# Patient Record
Sex: Female | Born: 2000 | Race: White
Health system: Southern US, Community
[De-identification: ages and names within clinical notes are randomized; demographics above are authoritative.]

## PROBLEM LIST (undated history)

## (undated) DIAGNOSIS — F32A Depression, unspecified: Secondary | ICD-10-CM

## (undated) DIAGNOSIS — L600 Ingrowing nail: Secondary | ICD-10-CM

## (undated) DIAGNOSIS — F419 Anxiety disorder, unspecified: Secondary | ICD-10-CM

## (undated) DIAGNOSIS — M25571 Pain in right ankle and joints of right foot: Secondary | ICD-10-CM

## (undated) DIAGNOSIS — J302 Other seasonal allergic rhinitis: Secondary | ICD-10-CM

## (undated) HISTORY — DX: Pain in right ankle and joints of right foot: M25.571

## (undated) HISTORY — DX: Ingrowing nail: L60.0

## (undated) HISTORY — PX: NO PAST SURGERIES: SHX2092

## (undated) HISTORY — DX: Anxiety disorder, unspecified: F41.9

## (undated) HISTORY — DX: Other seasonal allergic rhinitis: J30.2

## (undated) HISTORY — DX: Depression, unspecified: F32.A

---
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
## 2019-06-13 ENCOUNTER — Encounter (INDEPENDENT_AMBULATORY_CARE_PROVIDER_SITE_OTHER): Payer: Self-pay | Admitting: Foot & Ankle Surgery

## 2019-06-13 ENCOUNTER — Ambulatory Visit (INDEPENDENT_AMBULATORY_CARE_PROVIDER_SITE_OTHER): Payer: No Typology Code available for payment source | Admitting: Foot & Ankle Surgery

## 2019-06-13 VITALS — BP 118/78 | HR 76 | Wt 120.0 lb

## 2019-06-13 DIAGNOSIS — M79671 Pain in right foot: Secondary | ICD-10-CM

## 2019-06-13 DIAGNOSIS — S93601S Unspecified sprain of right foot, sequela: Secondary | ICD-10-CM

## 2019-06-13 NOTE — Progress Notes (Signed)
MRI RIGHT Foot

## 2019-06-13 NOTE — Progress Notes (Signed)
Podiatric Surgery Patient Visit    Patient Name: Marie Marie,Marie Marie  Assessment:   -Patient is 19 y.o. female with acute right midfoot pain-unclear diagnosis, likely strain or sprain  X-ray imaging and MRI imaging normal    Impression: Pain with unknown diagnosis (Acute complicated)    Plan:   -I personally evaluated and reviewed x-ray report from x-rays done on 05/11/2019.  These images were not available.  -I personally evaluated and reviewed MRI images on a disc.  These images were interpreted by me with no concerning findings  Full MRI report was scanned into epic  -Unclear at this point what caused her pain.  She does seem improved  -Prescription for new custom orthotics provided to patient.  She was seen and evaluated by specialized orthopedics  -Recommend a course of physical therapy as patient is already completed extensive period of rest including using a boot for 4 weeks.  Work on return to college level across  -F/U 6 weeks for reevaluation    ----------------------------------------------------------------------------------------------------------------------------  Amount/Complexity data reviewed (Choose 1 for level 2/3/4, 2 for level 5): Independent test interpretation (MRI, XR, etc) (level 4/5): X-ray report reviewed, MRI imaging independently interpreted        Subjective:     Chief Complaint:   Chief Complaint   Patient presents with   . Foot Pain     Right foot pain       HPI:  Marie Marie is a 19 y.o. female.  Who presents with chief complaint of right foot pain.  She is a new patient.  She is here with her mom today.  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  Pain came out of nowhere with no memorable injury.  She had an x-ray done which was negative.  She has the report with her.  She was in a cam boot for 4 weeks.  She is tried resting and ice.  She ultimately had an MRI done on 06/01/2019.  MRI imaging was done at [facility] diagnostic imaging.  She  has a copy of the CD with her today.  She feels overall better but is unclear what her diagnosis is and when she can return to sports.  She is here with her mom today.      Denies N/V/F/C/SOB/CP  All other systems were reviewed and are negative  The following portions of the patient's history were reviewed and updated as appropriate: allergies, current medications, past medical history, past surgical history, family history, and problem list.    Physical Exam:     Vitals:    06/13/19 0955   BP: 118/78   Pulse: 76   SpO2: 99%       Gen: AAOx3, NAD, Well developed  HEENT: Normocephalic, EOMI  Heart: Regular pulse rate and rhythm  Vascular:  Palpable pedal pulses b/l, CFT to all toes <3 sec. No edema. No varicosities  Derm:  Skin intact without wounds or rashes.  No erythema or ecchymosis.  MSK:   Overall exam the right foot has only minimal and mild tenderness over the first and second tarsometatarsal joint.  No gross instability.  No pain with inversion eversion stress.  MMS 5/5 and symmetric, No other areas of pain or tenderness. No gross deformity. ROM normal without crepitus.  Neuro:  SILT. Neg tinel's sign.  Able to wiggle toes. No motor deficits.       Standing and gait exam: Gait is nonantalgic.  Normal gait.  Radiology:     Xrays taken in clinic today: None    X-ray x3 views performed on 05/11/2019  Impression: Negative radiographs of the right foot    MRI imaging done at [facility] diagnostic imaging  Date of study 06/01/2019  Impression: No fractures or soft tissue abnormality.  Possible minimal degenerative changes at the first MTP joint.    I personally reviewed the disc of these images and agree that there is no concerning findings.        Claudette Head, DPM FACFAS  [facility] Podiatric Surgery  Pager [phone]    ------------------------------------------------------------------------------------------------------------------------------  The review of the patient's medications does not in any  way constitute an endorsement, by this clinician,  of their use, dosage, indications, route, efficacy, interactions, or other clinical parameters.    This note was generated within the EPIC EMR using Dragon medical speech recognition software and may contain inherent errors or omissions not intended by the user. Grammatical and punctuation errors, random word insertions, deletions, pronoun errors and incomplete sentences are occasional consequences of this technology due to software limitations. Not all errors are caught or corrected.  Although every attempt is made to root out erroneus and incomplete transcription, the note may still not fully represent the intent or opinion of the author. If there are questions or concerns about the content of this note or information contained within the body of this dictation they should be addressed directly with the author for clarification.

## 2019-07-05 ENCOUNTER — Encounter (INDEPENDENT_AMBULATORY_CARE_PROVIDER_SITE_OTHER): Payer: Self-pay | Admitting: Orthopaedic Surgery

## 2019-07-05 ENCOUNTER — Ambulatory Visit (INDEPENDENT_AMBULATORY_CARE_PROVIDER_SITE_OTHER): Payer: No Typology Code available for payment source | Admitting: Orthopaedic Surgery

## 2019-07-05 VITALS — BP 113/76 | HR 81

## 2019-07-05 DIAGNOSIS — S93621A Sprain of tarsometatarsal ligament of right foot, initial encounter: Secondary | ICD-10-CM

## 2019-07-05 NOTE — Progress Notes (Signed)
[facility] Orthopaedic [facility] Medicine        Provider: Aldean Baker, MD  Patient: Marie Mata  DOB: 28-Oct-2000  AGE: 19 y.o.  MR#:  [id number]  Sport/Activity: lacrosse at [employer]    SUBJECTIVE:  Marie Mata is a pleasant 19 y.o. female who presents for evaluation of right foot pain that started in mid March of this year. The patient cannot recall any specific inciting event, but she was in the middle of her lacrosse season when this happened. During this time, she said she was running and working out more than she ever had before. Of note, she first noted symptoms on the left side in a similar area but this only lasted a few weeks before the right side started bothering her. She was seen and imaged with plain films both locally and by a doctor at [facility] who did not find anything wrong. An MRI was done of her forefoot that does capture some of the Lisfranc area, that is being read as normal. She was placed into a boot for about 1 month. She has been out of the boot since mid May and walking in a normal shoe. She thinks the boot did help her some but her pain continued after transitioning out. She tried some Voltaren gel and this also did not help her.    The pain is definitely worse with activity, and weightbearing. Although she also gets some soreness in the foot at rest if she has been on her feet frequently.    She points to the maximum pain being at the base of the first and second metatarsals and at the first and second TMT joints. She states that when this first started she thinks she noticed some swelling in the foot but no bruising. Mostly her pain is dorsal but when this first happened she was also having plantar base pain as well    The patient is seen today with her mother who provides some of the history.    Past Medical History:   Diagnosis Date   . Ingrown toenail    . Seasonal allergic rhinitis      Past Surgical History:   Procedure Laterality Date   .  NO PAST SURGERIES       Current Outpatient Medications on File Prior to Visit   Medication Sig Dispense Refill   . budesonide-formoterol (SYMBICORT) 80-4.5 MCG/ACT inhaler INHALE TWO PUFFS BY MOUTH BEFORE ACTIVITY     . Fluticasone Propionate (XHANCE NA) by Nasal route Nasal spray     . Norgestim-Eth Estrad Triphasic 0.18/0.215/0.25 MG-25 MCG Tab Take 1 tablet by mouth daily     . sertraline (Zoloft) 50 MG tablet Take 50 mg by mouth daily     . albuterol sulfate HFA (PROVENTIL) 108 (90 Base) MCG/ACT inhaler Inhale 2 puffs into the lungs as needed for Wheezing     . [DISCONTINUED] amitriptyline (ELAVIL) 10 MG tablet Take 3 tablets by mouth at bedtime, wean as instructed 30 tablet 0   . [DISCONTINUED] cephalexin (KEFLEX) 500 MG capsule        No current facility-administered medications on file prior to visit.     Allergies   Allergen Reactions   . Peanut Oil Anaphylaxis   . Sulfa Antibiotics Hives     Social History     Socioeconomic History   . Marital status: Unknown     Spouse name: Not on file   . Number of children: Not on file   .  Years of education: Not on file   . Highest education level: Not on file   Occupational History   . Not on file   Tobacco Use   . Smoking status: Never Smoker   . Smokeless tobacco: Current User   Vaping Use   . Vaping Use: Never used   Substance and Sexual Activity   . Alcohol use: No   . Drug use: Not on file   . Sexual activity: Not on file   Other Topics Concern   . Not on file   Social History Narrative    ** Merged History Encounter **          Social Determinants of Health     Financial Resource Strain:    . Difficulty of Paying Living Expenses:    Food Insecurity:    . Worried About Programme researcher, broadcasting/film/video in the Last Year:    . Barista in the Last Year:    Transportation Needs:    . Freight forwarder (Medical):    Marland Kitchen Lack of Transportation (Non-Medical):    Physical Activity:    . Days of Exercise per Week:    . Minutes of Exercise per Session:    Stress:    . Feeling  of Stress :    Social Connections:    . Frequency of Communication with Friends and Family:    . Frequency of Social Gatherings with Friends and Family:    . Attends Religious Services:    . Active Member of Clubs or Organizations:    . Attends Banker Meetings:    Marland Kitchen Marital Status:    Intimate Partner Violence:    . Fear of Current or Ex-Partner:    . Emotionally Abused:    Marland Kitchen Physically Abused:    . Sexually Abused:      Family History   Problem Relation Age of Onset   . No known problems Mother    . No known problems Father        REVIEW OF SYSTEMS:  All other systems are negative except as mentioned in the HPI    PHYSICAL EXAM:  Vitals:    07/05/19 1133   BP: 113/76   Pulse: 81        General: The patient is a well appearing female who appears the stated age and is in no acute distress    Right foot and ankle:    Skin: intact, no open wounds    Swelling: none    Ecchymosis: absent    Hindfoot Alignment: neutral    Foot Alignment: neutral    Range of Motion:        Ankle joint dorsiflexion w/knee in extension: 5 deg        Ankle joint dorsiflexion w/knee in flexion: 10 deg        Ankle joint plantarflexion with knee: 45 deg   ROM with pain: no          Subtalar joint eversion: 5 deg        Subtalar joint inversion: 10 deg              ROM with pain: no    Tenderness: Globally at the Lisfranc joint, first TMT joint, second TMT joint, and intermetatarsal joint proximally between the first and second            Stability Exams:        Transverse tarsal joint stress: Positive  TMT shuck test: positive                Motor: 5/5 strength with dorsiflexion, plantarflexion, eversion, inversion, EHL, FHL, toe flexion and extension    Sensation: intact to light tough throughout superficial peroneal, deep peroneal, tibial, sural, saphenous nerve distributions    Vascular: 2+ DP and PT pulses      Left (Asymptomatic side) ankle  Alignment: neutral  No tenderness  Tenderness: none  Swelling: none  ROM:  normal  Normal NV exam    IMAGING STUDIES:  MRI of the right foot that was obtained on 06/01/2019 at [facility] diagnostic imaging in [place] was personally reviewed by me. This MRI is 1.5 Tesla that focuses mostly on the forefoot. In some views the Lisfranc joint is able to be visualized. There is some fluid in the proximal first and second metatarsal space with a questionable Lisfranc ligament injury in my opinion. No obvious widening    Official read from the radiologist is normal study.    OUTSIDE RECORDS  I also had a chance to review personally records from the patient's visit with Dr Dennie Fetters in our electronic medical record system.  These records show she was seen on 06/13/19. Physical therapy was ordered as well as orthotics.    IMPRESSION: Right lisfranc injury       ICD-10-CM    1. Lisfranc's sprain, right, initial encounter  [phone]        Plan:    I discussed the physical exam and radiographic findings, and provided information regarding the natural history of lisfranc injuries. Given her exam I recommend further imaging with a weightbearing CT. [facility] and bring the disc back with her. We discussed briefly the operative and nonoperative indications and protocols for this type of injury. I will see her back and discuss this further once we have the results from the CT. In the meantime, she does not necessarily need to go back into the boot while she is waiting for the CT to be done. If she does not have any pain well ambulating in a normal shoe, that she can continue to do so. I recommend avoidance of impact activity such as running or lacrosse in the meantime.      ORDERS PLACED    No orders of the defined types were placed in this encounter.

## 2019-07-12 ENCOUNTER — Encounter (INDEPENDENT_AMBULATORY_CARE_PROVIDER_SITE_OTHER): Payer: Self-pay | Admitting: Orthopaedic Surgery

## 2019-07-12 ENCOUNTER — Ambulatory Visit (INDEPENDENT_AMBULATORY_CARE_PROVIDER_SITE_OTHER): Payer: No Typology Code available for payment source | Admitting: Orthopaedic Surgery

## 2019-07-12 VITALS — BP 113/71 | HR 75

## 2019-07-12 DIAGNOSIS — S93621A Sprain of tarsometatarsal ligament of right foot, initial encounter: Secondary | ICD-10-CM

## 2019-07-12 NOTE — Progress Notes (Addendum)
[facility]        Provider: Aldean Baker, MD  Patient: Marie Mata  DOB: Jan 22, 2000  AGE: 19 y.o.  MR#:  [id number]  Sport/Activity: lacrosse at [employer]    SUBJECTIVE:  Marie Mata is a pleasant 19 y.o. female who presents for follow-up evaluation of right foot pain.  She was seen by me last week and there is concern for a Lisfranc injury.  I sent her for a weightbearing CT scan after the MRI was relatively inconclusive.    To review: her pain started in mid March of this year. The patient cannot recall any specific inciting event, but she was in the middle of her lacrosse season when this happened. During this time, she said she was running and working out more than she ever had before. Of note, she first noted symptoms on the left side in a similar area but this only lasted a few weeks before the right side started bothering her. She was seen and imaged with plain films both locally and by a doctor at [facility] who did not find anything wrong. An MRI was done of her forefoot that does capture some of the Lisfranc area, that is being read as normal. She was placed into a boot for about 1 month. She has been out of the boot since mid May and walking in a normal shoe. She thinks the boot did help her some but her pain continued after transitioning out. She tried some Voltaren gel and this also did not help her.    The pain is definitely worse with activity, and weightbearing. Although she also gets some soreness in the foot at rest if she has been on her feet frequently.    She points to the maximum pain being at the base of the first and second metatarsals and at the first and second TMT joints. She states that when this first started she thinks she noticed some swelling in the foot but no bruising. Mostly her pain is dorsal but when this first happened she was also having plantar base pain as well    The patient is seen today with her mother  who provides some of the history.    Past Medical History:   Diagnosis Date   . Ingrown toenail    . Seasonal allergic rhinitis      Past Surgical History:   Procedure Laterality Date   . NO PAST SURGERIES       Current Outpatient Medications on File Prior to Visit   Medication Sig Dispense Refill   . albuterol sulfate HFA (PROVENTIL) 108 (90 Base) MCG/ACT inhaler Inhale 2 puffs into the lungs as needed for Wheezing     . budesonide-formoterol (SYMBICORT) 80-4.5 MCG/ACT inhaler INHALE TWO PUFFS BY MOUTH BEFORE ACTIVITY     . Fluticasone Propionate (XHANCE NA) by Nasal route Nasal spray     . Norgestim-Eth Estrad Triphasic 0.18/0.215/0.25 MG-25 MCG Tab Take 1 tablet by mouth daily     . sertraline (Zoloft) 50 MG tablet Take 50 mg by mouth daily       No current facility-administered medications on file prior to visit.     Allergies   Allergen Reactions   . Peanut Oil Anaphylaxis   . Sulfa Antibiotics Hives     Social History     Socioeconomic History   . Marital status: Unknown     Spouse name: Not on file   . Number of  children: Not on file   . Years of education: Not on file   . Highest education level: Not on file   Occupational History   . Not on file   Tobacco Use   . Smoking status: Never Smoker   . Smokeless tobacco: Current User   Vaping Use   . Vaping Use: Never used   Substance and Sexual Activity   . Alcohol use: No   . Drug use: Not on file   . Sexual activity: Not on file   Other Topics Concern   . Not on file   Social History Narrative    ** Merged History Encounter **          Social Determinants of Health     Financial Resource Strain:    . Difficulty of Paying Living Expenses:    Food Insecurity:    . Worried About Programme researcher, broadcasting/film/video in the Last Year:    . Barista in the Last Year:    Transportation Needs:    . Freight forwarder (Medical):    Marland Kitchen Lack of Transportation (Non-Medical):    Physical Activity:    . Days of Exercise per Week:    . Minutes of Exercise per Session:    Stress:     . Feeling of Stress :    Social Connections:    . Frequency of Communication with Friends and Family:    . Frequency of Social Gatherings with Friends and Family:    . Attends Religious Services:    . Active Member of Clubs or Organizations:    . Attends Banker Meetings:    Marland Kitchen Marital Status:    Intimate Partner Violence:    . Fear of Current or Ex-Partner:    . Emotionally Abused:    Marland Kitchen Physically Abused:    . Sexually Abused:      Family History   Problem Relation Age of Onset   . No known problems Mother    . No known problems Father        REVIEW OF SYSTEMS:  All other systems are negative except as mentioned in the HPI    PHYSICAL EXAM:  There were no vitals filed for this visit.     General: The patient is a well appearing female who appears the stated age and is in no acute distress    Right foot and ankle:    Skin: intact, no open wounds    Swelling: none    Ecchymosis: absent    Hindfoot Alignment: neutral    Foot Alignment: neutral    Range of Motion:        Ankle joint dorsiflexion w/knee in extension: 5 deg        Ankle joint dorsiflexion w/knee in flexion: 10 deg        Ankle joint plantarflexion with knee: 45 deg   ROM with pain: no          Subtalar joint eversion: 5 deg        Subtalar joint inversion: 10 deg              ROM with pain: no    Tenderness: Globally at the Lisfranc joint, first TMT joint, second TMT joint, and intermetatarsal joint proximally between the first and second            Stability Exams:        Transverse tarsal joint stress: Positive  TMT shuck test: positive                Motor: 5/5 strength with dorsiflexion, plantarflexion, eversion, inversion, EHL, FHL, toe flexion and extension    Sensation: intact to light tough throughout superficial peroneal, deep peroneal, tibial, sural, saphenous nerve distributions    Vascular: 2+ DP and PT pulses      Left (Asymptomatic side) ankle  Alignment: neutral  No tenderness  Tenderness: none  Swelling: none  ROM:  normal  Normal NV exam    IMAGING STUDIES:  Weightbearing CT scan of bilateral feet was performed last week on 07/05/2019 at an outside facility and personally reviewed by me.  There is no obvious widening at the TMT, intermetatarsal, or medial cuneiform and second metatarsal joints.    MRI of the right foot that was obtained on 06/01/2019 at [facility] diagnostic imaging in [place] was personally reviewed by me. This MRI is 1.5 Tesla that focuses mostly on the forefoot. In some views the Lisfranc joint is able to be visualized. There is some fluid in the proximal first and second metatarsal space with a questionable Lisfranc ligament injury in my opinion. No obvious widening    Official read from the radiologist is normal study.    IMPRESSION: Right lisfranc sprain      ICD-10-CM    1. Lisfranc's sprain, right, initial encounter  Z61.096E Crutches     Other (DME)     Ambulatory referral to Physical Therapy       Plan:    I discussed the physical exam and radiographic findings, and provided information regarding the natural history of lisfranc injuries.  We can treat her conservatively as a sprain at this time.  I recommend that she is nonweightbearing in a boot for 4 weeks.  I discussed with her the possibility and utility of a PRP injection into the space, and she and her mother are interested in learning more.  I will refer her to Dr. Merril Abbe for evaluation and injection if they so choose.  I will see her back after 4 weeks and likely begin a weightbearing progression at that point.      Given her lack of displacement on CT and the relatively normal MRI, I hope this can be resolved conseratively.  I estimate for her that she may feel like she can return in 3 to 5 months, with 3 months being the best case scenario.  I provided a prescription for physical therapy today to begin in the next 2 weeks to start some gentle ankle range of motion and do some calf stem to prevent atrophy.  We will advance the PT  protocol as her exam improves.      At the follow-up visit, I will recommend she obtain longitudinal arch supports for her shoes as she transitions into normal shoewear    ORDERS PLACED    No orders of the defined types were placed in this encounter.

## 2019-07-13 ENCOUNTER — Encounter (INDEPENDENT_AMBULATORY_CARE_PROVIDER_SITE_OTHER): Payer: Self-pay | Admitting: Sports Medicine

## 2019-07-13 ENCOUNTER — Ambulatory Visit (INDEPENDENT_AMBULATORY_CARE_PROVIDER_SITE_OTHER): Payer: No Typology Code available for payment source | Admitting: Sports Medicine

## 2019-07-13 VITALS — BP 117/70 | HR 75 | Ht 62.0 in | Wt 120.0 lb

## 2019-07-13 DIAGNOSIS — S93621A Sprain of tarsometatarsal ligament of right foot, initial encounter: Secondary | ICD-10-CM

## 2019-07-13 DIAGNOSIS — Z0000 Encounter for general adult medical examination without abnormal findings: Secondary | ICD-10-CM

## 2019-07-13 NOTE — Progress Notes (Signed)
[facility]       Provider: Zack Seal, MD  Date of Exam: 07/13/2019  Patient:  Marie Mata  DOB: [birth year]  AGE: 19 y.o.  MR#:  [id number]    CC: Right Foot Pain    HPI:  Marie Mata is a 19 y.o.  female who presents with c/o midfoot pain which started about 3 months ago. There was no known traumatic injury. However, she does play college lacrosse at [employer] and is extremely active. Her pain is achy and worse with running. An MRI was done on her forefoot and was reportedly benign. A weightbearing CT was later performed and was also normal. She was initially treated with a walking boot for 1 month. There was perhaps some relief with this. However, she continues to have pain with weightbearing, running, and other activities. She was recently seen by Dr. Vincenza Hews and thought to have a lisfrac sprain. She was then referred to me for further evaluation and discussion of treatment options, including the possible use of PRP.    EXAM:   BP 117/70   Pulse 75   Ht 1.575 m (5\' 2" )   Wt 54.4 kg (120 lb)   SpO2 96%   BMI 21.95 kg/m   Constitutional: Pt is well-developed, well-nourished. No distress.   Head: Normocephalic, atraumatic.   Skin: No rash visualized. Pt is not diaphoretic.   Psychiatric: Affect normal. Mood normal.    Right Foot/Ankle  Observation: antalgic gait; no swelling, deformity or ecchymosis  Plantar Arches: pes cavus  Palpation: tender over lisfranc joint  ROM: tight posterior heel cord  Drawer Test: Negative  Talar Tilt Test: negative  External Rotation: negative  Strength testing: globally 5/5  Thompson's Test: normal  Distal Sensory: normal  Distal pulses:  Intact    Data Reviewed  External Notes: I have reviewed the prior notes with each unique source listed:   1. Claudette Head on 06/13/19  2. Aldean Baker on 07/05/19  3. Carleene Cooper on 04/23/19    Outside Radiology:   CLINICAL DATA: Right foot pain towards base of first metatarsal x2    months. Patient states increased pain with walking and running but   also complains of pain at rest. Patient states plantar pain while   walking. No known injury.      EXAM:   RIGHT FOOT COMPLETE - 3+ VIEW      COMPARISON: None.      FINDINGS:   There is no evidence of fracture or dislocation. There is no   evidence of arthropathy or other focal bone abnormality. Soft   tissues are unremarkable.      IMPRESSION:   Negative radiographs of the right foot.       ASSESSMENT/PLAN:   1. Healthcare maintenance  Vitamin D,25 OH, Total   2. Sprain of ligament of tarsometatarsal joint of right foot, initial encounter       Marie Mata is a very pleasant 19 y.o. female with midfoot pain, suspected lisfranc sprain. We had a lengthy discussion about the diagnoses as well as the various treatment options for these problems. The option of PRP was discussed and not recommended at this time for this problem. I have recommended that we check a vitamin D level and supplement this should her levels be low. She will follow-up with Dr. Vincenza Hews as planned and see me back on an as needed basis.    Teena Dunk, MD CAQSM  Primary Care  Sports Medicine Physician  [facility]    ----------------------------------------------------------------------------------------------------------------------------  Impression:   (Acute uncomplicated)  Amount/Complexity data reviewed: Order each test, Review each result/external note, Independent history (need 3 for level 4/5): notes  Risk of Problem/Management: Low (99203/99213)  ------------------------------------------------------------------------------------------------------------------------------  The review of the patient's medications does not in any way constitute an endorsement, by this clinician, of their use, dosage, indications, route, efficacy, interactions, or other clinical parameters.    This note was generated within the EPIC EMR using Dragon  medical speech recognition software and may contain inherent errors or omissions not intended by the user. Grammatical and punctuation errors, random word insertions, deletions, pronoun errors and incomplete sentences are occasional consequences of this technology due to software limitations. Not all errors are caught or corrected.  Although every attempt is made to root out erroneus and incomplete transcription, the note may still not fully represent the intent or opinion of the author. If there are questions or concerns about the content of this note or information contained within the body of this dictation they should be addressed directly with the author for clarification.

## 2019-07-17 ENCOUNTER — Telehealth (INDEPENDENT_AMBULATORY_CARE_PROVIDER_SITE_OTHER): Payer: No Typology Code available for payment source | Admitting: Orthopaedic Surgery

## 2019-07-17 DIAGNOSIS — S93621D Sprain of tarsometatarsal ligament of right foot, subsequent encounter: Secondary | ICD-10-CM

## 2019-07-24 NOTE — Progress Notes (Signed)
This is a telephone visit conducted over the phone with the patient and her mother.    I had a discussion with the patient and her mother regarding the utility of further MRI studies.  She has sought a second opinion and a repeat MRI of the midfoot was suggested given the poor quality of the first study that was done.  The patient has been nonweightbearing in a cam boot for the past 5 days and reports improvement in her pain.    I think that a repeat MRI of the midfoot is a good idea given the fact that the Lisfranc ligaments are poorly visualized on the initial MRI.  She has received multiple different medical opinions, therefore more information is always more helpful.    The patient will obtain this MRI and will let me know when it is finished so I can review it and discuss the results with her.  A total of 20 minutes was spent on the phone with the patient and her mother answering questions and discussing treatment options and diagnostic studies.

## 2019-07-31 ENCOUNTER — Ambulatory Visit (INDEPENDENT_AMBULATORY_CARE_PROVIDER_SITE_OTHER): Payer: No Typology Code available for payment source | Admitting: Foot & Ankle Surgery

## 2019-08-09 ENCOUNTER — Ambulatory Visit (INDEPENDENT_AMBULATORY_CARE_PROVIDER_SITE_OTHER): Payer: No Typology Code available for payment source | Admitting: Orthopaedic Surgery

## 2019-08-09 ENCOUNTER — Encounter (INDEPENDENT_AMBULATORY_CARE_PROVIDER_SITE_OTHER): Payer: Self-pay | Admitting: Orthopaedic Surgery

## 2019-08-09 VITALS — BP 120/69 | HR 71

## 2019-08-09 DIAGNOSIS — S93621A Sprain of tarsometatarsal ligament of right foot, initial encounter: Secondary | ICD-10-CM

## 2019-08-09 NOTE — Progress Notes (Signed)
[facility]        Provider: Aldean Baker, MD  Patient: Marie Mata  DOB: 2000/05/25  AGE: 19 y.o.  MR#:  [id number]  Sport/Activity: lacrosse at [employer]    SUBJECTIVE:  Marie Mata is a pleasant 19 y.o. female who presents for follow-up evaluation of right foot pain, suspected Lisfranc sprain.  She was last seen by me 1 month ago, where we decided to try a period of immobilization in a cam boot, nonweightbearing, for 1 month.  She has been compliant with this and has not put any weight on the foot.  She has no pain.  She has continued to be in physical therapy to maintain joint mobilization.      To review: her pain started in mid March of this year. The patient cannot recall any specific inciting event, but she was in the middle of her lacrosse season when this happened. During this time, she said she was running and working out more than she ever had before. Of note, she first noted symptoms on the left side in a similar area but this only lasted a few weeks before the right side started bothering her. She was seen and imaged with plain films both locally and by a doctor at [facility] who did not find anything wrong. An MRI was done of her forefoot that does capture some of the Lisfranc area, that is being read as normal. She was placed into a boot for about 1 month. She has been out of the boot since mid May and walking in a normal shoe. She thinks the boot did help her some but her pain continued after transitioning out. She tried some Voltaren gel and this also did not help her. The pain is definitely worse with activity, and weightbearing. Although she also gets some soreness in the foot at rest if she has been on her feet frequently.    She also had a weightbearing CT performed which demonstrated no diastases.  She most recently had a repeat MRI of the midfoot.    The patient is seen today with her mother who provides some of the  history.    Past Medical History:   Diagnosis Date   . Ingrown toenail    . Seasonal allergic rhinitis      Past Surgical History:   Procedure Laterality Date   . NO PAST SURGERIES       Current Outpatient Medications on File Prior to Visit   Medication Sig Dispense Refill   . budesonide-formoterol (SYMBICORT) 80-4.5 MCG/ACT inhaler INHALE TWO PUFFS BY MOUTH BEFORE ACTIVITY     . Fluticasone Propionate (XHANCE NA) by Nasal route Nasal spray     . Norgestim-Eth Estrad Triphasic 0.18/0.215/0.25 MG-25 MCG Tab Take 1 tablet by mouth daily     . sertraline (Zoloft) 50 MG tablet Take 50 mg by mouth daily     . albuterol sulfate HFA (PROVENTIL) 108 (90 Base) MCG/ACT inhaler Inhale 2 puffs into the lungs as needed for Wheezing       No current facility-administered medications on file prior to visit.     Allergies   Allergen Reactions   . Peanut Oil Anaphylaxis   . Sulfa Antibiotics Hives     Social History     Socioeconomic History   . Marital status: Unknown     Spouse name: Not on file   . Number of children: Not on file   . Years  of education: Not on file   . Highest education level: Not on file   Occupational History   . Not on file   Tobacco Use   . Smoking status: Never Smoker   . Smokeless tobacco: Current User   Vaping Use   . Vaping Use: Never used   Substance and Sexual Activity   . Alcohol use: No   . Drug use: Not on file   . Sexual activity: Not on file   Other Topics Concern   . Not on file   Social History Narrative    ** Merged History Encounter **          Social Determinants of Health     Financial Resource Strain:    . Difficulty of Paying Living Expenses:    Food Insecurity:    . Worried About Programme researcher, broadcasting/film/video in the Last Year:    . Barista in the Last Year:    Transportation Needs:    . Freight forwarder (Medical):    Marland Kitchen Lack of Transportation (Non-Medical):    Physical Activity:    . Days of Exercise per Week:    . Minutes of Exercise per Session:    Stress:    . Feeling of Stress :     Social Connections:    . Frequency of Communication with Friends and Family:    . Frequency of Social Gatherings with Friends and Family:    . Attends Religious Services:    . Active Member of Clubs or Organizations:    . Attends Banker Meetings:    Marland Kitchen Marital Status:    Intimate Partner Violence:    . Fear of Current or Ex-Partner:    . Emotionally Abused:    Marland Kitchen Physically Abused:    . Sexually Abused:      Family History   Problem Relation Age of Onset   . No known problems Mother    . No known problems Father        REVIEW OF SYSTEMS:  All other systems are negative except as mentioned in the HPI    PHYSICAL EXAM:  Vitals:    08/09/19 1401   BP: 120/69   Pulse: 71        General: The patient is a well appearing female who appears the stated age and is in no acute distress    Right foot and ankle:    Skin: intact, no open wounds    Swelling: none    Ecchymosis: absent    Hindfoot Alignment: neutral    Foot Alignment: neutral    Range of Motion:        Ankle joint dorsiflexion w/knee in extension: 5 deg        Ankle joint dorsiflexion w/knee in flexion: 10 deg        Ankle joint plantarflexion with knee: 45 deg   ROM with pain: no          Subtalar joint eversion: 5 deg        Subtalar joint inversion: 10 deg              ROM with pain: no    Tenderness: none today            Stability Exams:        Transverse tarsal joint stress: negative        TMT shuck test: negative  Motor: 5/5 strength with dorsiflexion, plantarflexion, eversion, inversion, EHL, FHL, toe flexion and extension    Sensation: intact to light tough throughout superficial peroneal, deep peroneal, tibial, sural, saphenous nerve distributions    Vascular: 2+ DP and PT pulses      Left (Asymptomatic side) ankle  Alignment: neutral  No tenderness  Tenderness: none  Swelling: none  ROM: normal  Normal NV exam    IMAGING STUDIES:  Repeat MRI done recently at [facility] of the right midfoot was personally reviewed by me and I agree  with the findings that it is an unremarkable study, there is no bony edema, no evidence of ligamentous injury.    IMPRESSION: Right lisfranc sprain, resolving      ICD-10-CM    1. Sprain of ligament of tarsometatarsal joint of right foot, initial encounter  [phone]        Plan:    I am happy to hear that her pain at least in her current nonweightbearing state has resolved.  I think at this point, given the recent normal MRI, we can proceed with resuming weightbearing and return to activities.  I would like her to do this in a slow, progressive, and controlled manner, to prevent recurrence of her pain or onset of any new injuries.  Over the next week to week and a half I would like her to do a weightbearing progression in the boot followed by a boot wean.  I provided her instructions on how to do this.  I would like her to continue to work with physical therapy to regain normal gait, followed by improved single-leg proprioception.  Once she has mastered this, we can work on a slow return to jog protocol and eventually return to play, which I anticipate ultimately might happen in the next 2 months.      She returns back to school in 1 month.  I would like to see her back right before she leaves, so we can assess how she is doing and I can make recommendations to her trainer down to [facility].     ORDERS PLACED    No orders of the defined types were placed in this encounter.

## 2019-09-05 NOTE — Progress Notes (Signed)
[facility]        Provider: Aldean Baker, MD  Patient: Marie Mata  DOB: 03/16/2000  AGE: 19 y.o.  MR#:  [id number]  Sport/Activity: lacrosse at [employer]    SUBJECTIVE:  Marie Mata is a pleasant 19 y.o. female who presents for follow-up evaluation of right foot pain, suspected Lisfranc sprain.  She was last seen by me 1 month ago, and we continued to advance her activity with PT in preparation for her to return to school.  She has no pain in the foot at this time.  She began running on Tuesday and was able to jog for 5 minutes on the treadmill.    To review: her pain started in mid March of this year. The patient cannot recall any specific inciting event, but she was in the middle of her lacrosse season when this happened. During this time, she said she was running and working out more than she ever had before. Of note, she first noted symptoms on the left side in a similar area but this only lasted a few weeks before the right side started bothering her. She was seen and imaged with plain films both locally and by a doctor at [facility] who did not find anything wrong. An MRI was done of her forefoot that does capture some of the Lisfranc area, that is being read as normal. She was placed into a boot for about 1 month. She has been out of the boot since mid May and walking in a normal shoe. She thinks the boot did help her some but her pain continued after transitioning out. She tried some Voltaren gel and this also did not help her. The pain is definitely worse with activity, and weightbearing. Although she also gets some soreness in the foot at rest if she has been on her feet frequently.    She also had a weightbearing CT performed which demonstrated no diastases.  She most recently had a repeat MRI of the midfoot.    The patient is seen today with her mother who provides some of the history.    Past Medical History:   Diagnosis Date   .  Ingrown toenail    . Seasonal allergic rhinitis      Past Surgical History:   Procedure Laterality Date   . NO PAST SURGERIES       Current Outpatient Medications on File Prior to Visit   Medication Sig Dispense Refill   . albuterol sulfate HFA (PROVENTIL) 108 (90 Base) MCG/ACT inhaler Inhale 2 puffs into the lungs as needed for Wheezing     . budesonide-formoterol (SYMBICORT) 80-4.5 MCG/ACT inhaler INHALE TWO PUFFS BY MOUTH BEFORE ACTIVITY     . Fluticasone Propionate (XHANCE NA) by Nasal route Nasal spray     . Norgestim-Eth Estrad Triphasic 0.18/0.215/0.25 MG-25 MCG Tab Take 1 tablet by mouth daily     . sertraline (Zoloft) 50 MG tablet Take 50 mg by mouth daily       No current facility-administered medications on file prior to visit.     Allergies   Allergen Reactions   . Peanut Oil Anaphylaxis   . Sulfa Antibiotics Hives     Social History     Socioeconomic History   . Marital status: Unknown     Spouse name: Not on file   . Number of children: Not on file   . Years of education: Not on file   .  Highest education level: Not on file   Occupational History   . Not on file   Tobacco Use   . Smoking status: Never Smoker   . Smokeless tobacco: Current User   Vaping Use   . Vaping Use: Never used   Substance and Sexual Activity   . Alcohol use: No   . Drug use: Not on file   . Sexual activity: Not on file   Other Topics Concern   . Not on file   Social History Narrative    ** Merged History Encounter **          Social Determinants of Health     Financial Resource Strain:    . Difficulty of Paying Living Expenses:    Food Insecurity:    . Worried About Programme researcher, broadcasting/film/video in the Last Year:    . Barista in the Last Year:    Transportation Needs:    . Freight forwarder (Medical):    Marland Kitchen Lack of Transportation (Non-Medical):    Physical Activity:    . Days of Exercise per Week:    . Minutes of Exercise per Session:    Stress:    . Feeling of Stress :    Social Connections:    . Frequency of Communication  with Friends and Family:    . Frequency of Social Gatherings with Friends and Family:    . Attends Religious Services:    . Active Member of Clubs or Organizations:    . Attends Banker Meetings:    Marland Kitchen Marital Status:    Intimate Partner Violence:    . Fear of Current or Ex-Partner:    . Emotionally Abused:    Marland Kitchen Physically Abused:    . Sexually Abused:      Family History   Problem Relation Age of Onset   . No known problems Mother    . No known problems Father        REVIEW OF SYSTEMS:  All other systems are negative except as mentioned in the HPI    PHYSICAL EXAM:  There were no vitals filed for this visit.     General: The patient is a well appearing female who appears the stated age and is in no acute distress    Right foot and ankle:    Skin: intact, no open wounds    Swelling: none    Ecchymosis: absent    Hindfoot Alignment: neutral    Foot Alignment: neutral    Range of Motion:        Ankle joint dorsiflexion w/knee in extension: 5 deg        Ankle joint dorsiflexion w/knee in flexion: 10 deg        Ankle joint plantarflexion with knee: 45 deg   ROM with pain: no          Subtalar joint eversion: 5 deg        Subtalar joint inversion: 10 deg              ROM with pain: no    Tenderness: none today            Stability Exams:        Transverse tarsal joint stress: negative        TMT shuck test: negative                Motor: 5/5 strength with dorsiflexion, plantarflexion, eversion, inversion, EHL, FHL, toe flexion  and extension    Sensation: intact to light tough throughout superficial peroneal, deep peroneal, tibial, sural, saphenous nerve distributions    Vascular: 2+ DP and PT pulses      Left (Asymptomatic side) ankle  Alignment: neutral  No tenderness  Tenderness: none  Swelling: none  ROM: normal  Normal NV exam    IMAGING STUDIES:  None new    IMPRESSION: Right lisfranc sprain, resolving      ICD-10-CM    1. Lisfranc's sprain, right, initial encounter  [phone]        Plan:    I am happy  to hear that Almira Coaster is doing better.  I think we can continue to advance her activity.  She should's continue to progress through a return to running protocol over the next few weeks.  Sport specific drills and strengthening exercises are encouraged.  I anticipate she will be able to return to play without any issues in about 1 month's time.  A note was provided today for her to give her trainer    She is any back to school on Saturday.  If she has any issues throughout the season she should reach out to [place].  I also recommend using arch support inserts in her cleats and her sneakers to help support the midfoot.    ORDERS PLACED    No orders of the defined types were placed in this encounter.

## 2019-09-06 ENCOUNTER — Ambulatory Visit (INDEPENDENT_AMBULATORY_CARE_PROVIDER_SITE_OTHER): Payer: No Typology Code available for payment source | Admitting: Orthopaedic Surgery

## 2019-09-06 ENCOUNTER — Encounter (INDEPENDENT_AMBULATORY_CARE_PROVIDER_SITE_OTHER): Payer: Self-pay | Admitting: Orthopaedic Surgery

## 2019-09-06 VITALS — BP 100/97 | HR 77

## 2019-09-06 DIAGNOSIS — S93621A Sprain of tarsometatarsal ligament of right foot, initial encounter: Secondary | ICD-10-CM

## 2019-11-01 ENCOUNTER — Encounter (INDEPENDENT_AMBULATORY_CARE_PROVIDER_SITE_OTHER): Payer: Self-pay

## 2019-11-01 ENCOUNTER — Ambulatory Visit (INDEPENDENT_AMBULATORY_CARE_PROVIDER_SITE_OTHER): Payer: No Typology Code available for payment source | Admitting: Orthopaedic Surgery

## 2019-11-01 ENCOUNTER — Ambulatory Visit (INDEPENDENT_AMBULATORY_CARE_PROVIDER_SITE_OTHER): Payer: No Typology Code available for payment source

## 2019-11-01 ENCOUNTER — Encounter (INDEPENDENT_AMBULATORY_CARE_PROVIDER_SITE_OTHER): Payer: Self-pay | Admitting: Orthopaedic Surgery

## 2019-11-01 VITALS — BP 117/71 | HR 67

## 2019-11-01 DIAGNOSIS — M25571 Pain in right ankle and joints of right foot: Secondary | ICD-10-CM

## 2019-11-01 NOTE — Progress Notes (Signed)
[facility]        Provider: Aldean Baker, MD  Patient: Marie Mata  DOB: [birth year]  AGE: 19 y.o.  MR#:  [id number]  Sport/Activity: lacrosse at [employer]    SUBJECTIVE:  Angelina Venard is a pleasant 19 y.o. female who presents for follow-up evaluation of right foot and ankle pain.  She was last seen by me a few months ago as we had treated her over the summer for a Lisfranc sprain.  That is completely resolved, but for the past month she has had anterior lateral ankle pain on that side.  Pain is worse with activity and weightbearing.  She has no pain at rest.  She gets some pain along the anterior joint line in general but most of it is focal.  She denies any swelling or mechanical symptoms.    She has had x-rays done back at school but does not have them with her today.  She has been working with her trainer at school but without any improvement.  She is in fall ball for the lacrosse season and finds that the ankle bothers her on a daily basis and limits the amount that she is able to participate in.    The patient is seen today with her mother who provides some of the history.    Past Medical History:   Diagnosis Date   . Ingrown toenail    . Seasonal allergic rhinitis      Past Surgical History:   Procedure Laterality Date   . NO PAST SURGERIES       Current Outpatient Medications on File Prior to Visit   Medication Sig Dispense Refill   . albuterol sulfate HFA (PROVENTIL) 108 (90 Base) MCG/ACT inhaler Inhale 2 puffs into the lungs as needed for Wheezing     . budesonide-formoterol (SYMBICORT) 80-4.5 MCG/ACT inhaler INHALE TWO PUFFS BY MOUTH BEFORE ACTIVITY     . Fluticasone Propionate (XHANCE NA) by Nasal route Nasal spray     . Norgestim-Eth Estrad Triphasic 0.18/0.215/0.25 MG-25 MCG Tab Take 1 tablet by mouth daily     . sertraline (ZOLOFT) 100 MG tablet      . sertraline (Zoloft) 50 MG tablet Take 50 mg by mouth daily       No current  facility-administered medications on file prior to visit.     Allergies   Allergen Reactions   . Peanut Oil Anaphylaxis   . Sulfa Antibiotics Hives     Social History     Socioeconomic History   . Marital status: Unknown     Spouse name: Not on file   . Number of children: Not on file   . Years of education: Not on file   . Highest education level: Not on file   Occupational History   . Not on file   Tobacco Use   . Smoking status: Never Smoker   . Smokeless tobacco: Current User   Vaping Use   . Vaping Use: Never used   Substance and Sexual Activity   . Alcohol use: No   . Drug use: Not on file   . Sexual activity: Not on file   Other Topics Concern   . Not on file   Social History Narrative    ** Merged History Encounter **          Social Determinants of Health     Financial Resource Strain:    . Difficulty of Paying Living Expenses:  Food Insecurity:    . Worried About Programme researcher, broadcasting/film/video in the Last Year:    . Barista in the Last Year:    Transportation Needs:    . Freight forwarder (Medical):    Marland Kitchen Lack of Transportation (Non-Medical):    Physical Activity:    . Days of Exercise per Week:    . Minutes of Exercise per Session:    Stress:    . Feeling of Stress :    Social Connections:    . Frequency of Communication with Friends and Family:    . Frequency of Social Gatherings with Friends and Family:    . Attends Religious Services:    . Active Member of Clubs or Organizations:    . Attends Banker Meetings:    Marland Kitchen Marital Status:    Intimate Partner Violence:    . Fear of Current or Ex-Partner:    . Emotionally Abused:    Marland Kitchen Physically Abused:    . Sexually Abused:      Family History   Problem Relation Age of Onset   . No known problems Mother    . No known problems Father        REVIEW OF SYSTEMS:  All other systems are negative except as mentioned in the HPI    PHYSICAL EXAM:  Vitals:    11/01/19 0806   BP: 117/71   Pulse: 67   SpO2: 97%        General: The patient is a well  appearing female who appears the stated age and is in no acute distress    Right foot and ankle:    Skin: intact, no open wounds    Swelling: none    Ecchymosis: absent    Hindfoot Alignment: neutral    Foot Alignment: neutral    Range of Motion:        Ankle joint dorsiflexion w/knee in extension: 5 deg        Ankle joint dorsiflexion w/knee in flexion: 10 deg        Ankle joint plantarflexion with knee: 45 deg   ROM with pain: no          Subtalar joint eversion: 5 deg        Subtalar joint inversion: 10 deg              ROM with pain: no    Tenderness:   Tender to palpation along the anterior lateral aspect of the joint line, but slightly distal to the tibiotalar joint, along the anterior lateral dorsal talar neck          Stability Exams:        Negative anterior drawer  Negative talar tilt  Negative external rotation stress                Motor: 5/5 strength with dorsiflexion, plantarflexion, eversion, inversion, EHL, FHL, toe flexion and extension    Sensation: intact to light tough throughout superficial peroneal, deep peroneal, tibial, sural, saphenous nerve distributions    Vascular: 2+ DP and PT pulses      Left (Asymptomatic side) ankle  Alignment: neutral  No tenderness  Tenderness: none  Swelling: none  ROM: normal  Normal NV exam    IMAGING STUDIES:  None new    IMPRESSION: Right lisfranc sprain, resolved  New acute right ankle pain, AL ankle joint, concerning for poss talar OCD      ICD-10-CM  1. Acute right ankle pain  M25.571 X-ray ankle right AP lateral and oblique including standing     MRI ankle right without contrast       Plan:    We will order an MRI to evaluate for possible talar OCD.  She has pain and tenderness along the joint line that is worse with activity and weightbearing.  She has failed a month of conservative treatment including relative rest, activity modification, and physical therapy.    ORDERS PLACED    Orders Placed This Encounter   Procedures   . X-ray ankle right AP lateral  and oblique including standing

## 2019-11-01 NOTE — Progress Notes (Signed)
Faxed last two office notes from Dr. Vincenza Hews via Epic to [facility] to fax [phone] per request.

## 2019-11-02 ENCOUNTER — Ambulatory Visit
Admission: RE | Admit: 2019-11-02 | Discharge: 2019-11-02 | Disposition: A | Discharge status: Home / Self Care | Payer: No Typology Code available for payment source | Source: Ambulatory Visit | Attending: Orthopaedic Surgery | Admitting: Orthopaedic Surgery

## 2019-11-02 ENCOUNTER — Encounter (INDEPENDENT_AMBULATORY_CARE_PROVIDER_SITE_OTHER): Payer: Self-pay

## 2019-11-02 DIAGNOSIS — M25571 Pain in right ankle and joints of right foot: Secondary | ICD-10-CM | POA: Insufficient documentation

## 2019-11-02 NOTE — Progress Notes (Signed)
Auth/approval obtained via peer to peer with Dr. Vincenza Hews and [facility] MD: [id number]  I called [name] and spoke with her mother to let them know the MRI is now authorized. They will anticipate a call from Dr. Vincenza Hews to review the results.

## 2019-11-06 ENCOUNTER — Other Ambulatory Visit (INDEPENDENT_AMBULATORY_CARE_PROVIDER_SITE_OTHER): Payer: Self-pay | Admitting: Orthopaedic Surgery

## 2019-11-06 MED ORDER — METHYLPREDNISOLONE 4 MG PO TBPK
ORAL_TABLET | ORAL | 0 refills | Status: AC
Start: 2019-11-06 — End: 2019-11-12

## 2019-11-06 MED ORDER — DICLOFENAC SODIUM 75 MG PO TBEC
75.0000 mg | DELAYED_RELEASE_TABLET | Freq: Two times a day (BID) | ORAL | 0 refills | Status: AC
Start: 2019-11-06 — End: 2019-11-20

## 2019-11-07 ENCOUNTER — Other Ambulatory Visit (INDEPENDENT_AMBULATORY_CARE_PROVIDER_SITE_OTHER): Payer: Self-pay | Admitting: Orthopaedic Surgery

## 2019-11-07 DIAGNOSIS — M659 Synovitis and tenosynovitis, unspecified: Secondary | ICD-10-CM

## 2019-12-10 ENCOUNTER — Encounter (INDEPENDENT_AMBULATORY_CARE_PROVIDER_SITE_OTHER): Payer: Self-pay | Admitting: Orthopaedic Surgery

## 2019-12-10 ENCOUNTER — Ambulatory Visit (INDEPENDENT_AMBULATORY_CARE_PROVIDER_SITE_OTHER): Payer: No Typology Code available for payment source | Admitting: Orthopaedic Surgery

## 2019-12-10 VITALS — BP 115/73 | HR 78

## 2019-12-10 DIAGNOSIS — M25871 Other specified joint disorders, right ankle and foot: Secondary | ICD-10-CM

## 2019-12-10 MED ORDER — TRIAMCINOLONE ACETONIDE 40 MG/ML IJ SUSP
20.0000 mg | Freq: Once | INTRAMUSCULAR | Status: AC
Start: 2019-12-10 — End: ?

## 2019-12-10 NOTE — Progress Notes (Signed)
[facility]        Provider: Aldean Baker, MD  Patient: Marie Mata  DOB: 05/14/2000  AGE: 19 y.o.  MR#:  [id number]  Sport/Activity: lacrosse at [employer]    SUBJECTIVE:  Marie Mata is a pleasant 19 y.o. female who presents for follow-up evaluation of right ankle impingement.  Patient was last seen by me a little over 2 months ago and an MRI was ordered.  I had a follow-up visit with her via the telephone after the MRI which showed anterior ankle synovitis.  She at that time had been working with her trainer on ankle mobilization, but continued to have discomfort.  We tried a course of a Medrol Dosepak followed by a 2-week course of diclofenac to help alleviate some of the inflamed synovium.  She said that this did help a good deal and the pain almost completely went away, but it did return after the medication course was completed.  She denies any swelling in the ankle.  She has pain now with any hyper dorsiflexion or plantarflexion, and hurts her even to drive.    The patient is seen today with her father who provides some of the history.    Past Medical History:   Diagnosis Date   . Ingrown toenail    . Seasonal allergic rhinitis      Past Surgical History:   Procedure Laterality Date   . NO PAST SURGERIES       Current Outpatient Medications on File Prior to Visit   Medication Sig Dispense Refill   . albuterol sulfate HFA (PROVENTIL) 108 (90 Base) MCG/ACT inhaler Inhale 2 puffs into the lungs as needed for Wheezing     . budesonide-formoterol (SYMBICORT) 80-4.5 MCG/ACT inhaler INHALE TWO PUFFS BY MOUTH BEFORE ACTIVITY     . Fluticasone Propionate (XHANCE NA) by Nasal route Nasal spray     . Norgestim-Eth Estrad Triphasic 0.18/0.215/0.25 MG-25 MCG Tab Take 1 tablet by mouth daily     . sertraline (ZOLOFT) 100 MG tablet      . [DISCONTINUED] sertraline (Zoloft) 50 MG tablet Take 50 mg by mouth daily       No current facility-administered  medications on file prior to visit.     Allergies   Allergen Reactions   . Peanut Oil Anaphylaxis   . Sulfa Antibiotics Hives     Social History     Socioeconomic History   . Marital status: Unknown     Spouse name: Not on file   . Number of children: Not on file   . Years of education: Not on file   . Highest education level: Not on file   Occupational History   . Not on file   Tobacco Use   . Smoking status: Never Smoker   . Smokeless tobacco: Current User   Vaping Use   . Vaping Use: Never used   Substance and Sexual Activity   . Alcohol use: No   . Drug use: Not on file   . Sexual activity: Not on file   Other Topics Concern   . Not on file   Social History Narrative    ** Merged History Encounter **          Social Determinants of Health     Financial Resource Strain:    . Difficulty of Paying Living Expenses: Not on file   Food Insecurity:    . Worried About Programme researcher, broadcasting/film/video in  the Last Year: Not on file   . Ran Out of Food in the Last Year: Not on file   Transportation Needs:    . Lack of Transportation (Medical): Not on file   . Lack of Transportation (Non-Medical): Not on file   Physical Activity:    . Days of Exercise per Week: Not on file   . Minutes of Exercise per Session: Not on file   Stress:    . Feeling of Stress : Not on file   Social Connections:    . Frequency of Communication with Friends and Family: Not on file   . Frequency of Social Gatherings with Friends and Family: Not on file   . Attends Religious Services: Not on file   . Active Member of Clubs or Organizations: Not on file   . Attends Banker Meetings: Not on file   . Marital Status: Not on file   Intimate Partner Violence:    . Fear of Current or Ex-Partner: Not on file   . Emotionally Abused: Not on file   . Physically Abused: Not on file   . Sexually Abused: Not on file   Housing Stability:    . Unable to Pay for Housing in the Last Year: Not on file   . Number of Places Lived in the Last Year: Not on file   .  Unstable Housing in the Last Year: Not on file     Family History   Problem Relation Age of Onset   . No known problems Mother    . No known problems Father        REVIEW OF SYSTEMS:  All other systems are negative except as mentioned in the HPI    PHYSICAL EXAM:  Vitals:    12/10/19 1614   BP: 115/73   Pulse: 78        General: The patient is a well appearing female who appears the stated age and is in no acute distress    Right foot and ankle:    Skin: intact, no open wounds    Swelling: none    Ecchymosis: absent    Hindfoot Alignment: neutral    Foot Alignment: neutral    Range of Motion:        Ankle joint dorsiflexion w/knee in extension: 5 deg        Ankle joint dorsiflexion w/knee in flexion: 10 deg        Ankle joint plantarflexion with knee: 45 deg   ROM with pain: yes, at extremes of DF and PF          Subtalar joint eversion: 5 deg        Subtalar joint inversion: 10 deg              ROM with pain: no    Tenderness:   Tender to palpation along the anterolateral aspect of the tibiotalar joint line, but also some discomfort around the sinus tarsi          Stability Exams:        Negative anterior drawer  Negative talar tilt  Negative external rotation stress                Motor: 5/5 strength with dorsiflexion, plantarflexion, eversion, inversion, EHL, FHL, toe flexion and extension    Sensation: intact to light tough throughout superficial peroneal, deep peroneal, tibial, sural, saphenous nerve distributions    Vascular: 2+ DP and PT pulses  Left (Asymptomatic side) ankle  Alignment: neutral  No tenderness  Tenderness: none  Swelling: none  ROM: normal  Normal NV exam    IMAGING STUDIES:  None new    IMPRESSION:       ICD-10-CM    1. Ankle impingement syndrome, right  M25.871 triamcinolone acetonide (KENALOG-40) 40 MG/ML injection 20 mg       Plan:  At a long discussion with Almira Coaster and her father today about the treatment options.  She seemed to improve with anti-inflammatory medication regimen that she  was taking orally a few weeks ago but the pain has returned.  She has symptoms of anterior ankle impingement but also some tenderness along the sinus tarsi.  I discussed that a one-time low-dose steroid injection into the ankle joint could be of therapeutic and diagnostic utility for [place].  She is amenable to this.  She will resume physical therapy on Friday of this week, 4 days from now.  I will touch base with her via phone visit in about 3 weeks to see how her response has been to the injection.    ORDERS PLACED    No orders of the defined types were placed in this encounter.    Procedure: Right intra articular ankle corticosteriod injection  After discussing the risks, benefits and alternative treatments to steroid injections to the patient in detail, informed consent was obtained. The patient's right anterior medial ankle was then prepped in a sterile fashion followed by the use of ethyl chloride for anesthesia of the skin.  A 22-gauge needle was introduced intraarticularly with the injection of 2 mL of 1% Xylocaine and 20 mg of Kenalog. The needle was removed, the skin was cleaned and band-aid placed overlying the injection site. The patient was stable throughout there were no complications.

## 2019-12-31 ENCOUNTER — Telehealth (INDEPENDENT_AMBULATORY_CARE_PROVIDER_SITE_OTHER): Payer: No Typology Code available for payment source | Admitting: Orthopaedic Surgery

## 2019-12-31 ENCOUNTER — Encounter (INDEPENDENT_AMBULATORY_CARE_PROVIDER_SITE_OTHER): Payer: Self-pay

## 2019-12-31 DIAGNOSIS — M25871 Other specified joint disorders, right ankle and foot: Secondary | ICD-10-CM

## 2019-12-31 DIAGNOSIS — M659 Synovitis and tenosynovitis, unspecified: Secondary | ICD-10-CM

## 2019-12-31 NOTE — Progress Notes (Signed)
[facility]     TELEPHONE VISIT     Provider: Aldean Baker, MD  Patient: Marie Mata  DOB: [birth year]  AGE: 19 y.o.  MR#:  [id number]  Sport/Activity: lacrosse at [employer]    SUBJECTIVE:  This was a telephone visit  Daesha Damon Baisch is a pleasant 19 y.o. female who presents for follow-up evaluation of right ankle impingement.  Patient was last seen by me 3 weeks ago, at which point a right ankle intra-articular corticosteroid injection was performed. She had some persistent swelling and discomfort for the first week after this injection, but the pain seems to have alleviated some. I also recommended she immobilized in a cam boot, and she has been now ambulating mostly in the cam boot for the past week and a half. Overall she thinks she feels a good deal better. When she is in the boot she does not have any pain. She has not been wearing the boot around the house. She just got home on Sunday for a several week holiday break and is returning to school on January 2.    The patient went to physical therapy this morning back home and has plans to go 3 times per week. When she does nonweightbearing ankle range of motion exercises, she can feel some of the pinching in the anterior lateral aspect of her ankle, but is mild compared to before.    The patient is seen today with her father who provides some of the history.    Past Medical History:   Diagnosis Date   . Ingrown toenail    . Seasonal allergic rhinitis      Past Surgical History:   Procedure Laterality Date   . NO PAST SURGERIES       Current Outpatient Medications on File Prior to Visit   Medication Sig Dispense Refill   . albuterol sulfate HFA (PROVENTIL) 108 (90 Base) MCG/ACT inhaler Inhale 2 puffs into the lungs as needed for Wheezing     . budesonide-formoterol (SYMBICORT) 80-4.5 MCG/ACT inhaler INHALE TWO PUFFS BY MOUTH BEFORE ACTIVITY     . Fluticasone Propionate (XHANCE NA) by Nasal route Nasal  spray     . Norgestim-Eth Estrad Triphasic 0.18/0.215/0.25 MG-25 MCG Tab Take 1 tablet by mouth daily     . sertraline (ZOLOFT) 100 MG tablet        Current Facility-Administered Medications on File Prior to Visit   Medication Dose Route Frequency Provider Last Rate Last Admin   . triamcinolone acetonide (KENALOG-40) 40 MG/ML injection 20 mg  20 mg Intra-articular Once Salem Senate, MD         Allergies   Allergen Reactions   . Peanut Oil Anaphylaxis   . Sulfa Antibiotics Hives     Social History     Socioeconomic History   . Marital status: Unknown   Tobacco Use   . Smoking status: Never Smoker   . Smokeless tobacco: Current User   Vaping Use   . Vaping Use: Never used   Substance and Sexual Activity   . Alcohol use: No   Social History Narrative    ** Merged History Encounter **          Family History   Problem Relation Age of Onset   . No known problems Mother    . No known problems Father        REVIEW OF SYSTEMS:  All other systems are negative except as mentioned in  the HPI    PHYSICAL EXAM:  There were no vitals filed for this visit.      Right foot and ankle:    No exam was performed as this was a telephone visit.     IMAGING STUDIES:  None new    IMPRESSION:       ICD-10-CM    1. Ankle impingement syndrome, right  M25.871    2. Synovitis of right ankle  M65.9        Plan:  I would like her to continue wearing the boot for another week and a half whenever she is out of the house and about. She should continue with physical therapy here 3 times a week. I have discussed the case with her physical therapist and we will work on ankle and hindfoot range of motion exercises, as she may have some elements of sinus tarsi impingement as well. She will be in touch with me after Christmas as she starts to ramp back up with activity and let me know how she is doing. If she has persistent pain as she tries to get back into more advanced activity and out of the boot, she may be a candidate for a sinus tarsi  injection by my partner under the ultrasound. We will evaluate this as her symptoms progress.    ORDERS PLACED    No orders of the defined types were placed in this encounter.

## 2020-04-02 ENCOUNTER — Encounter (INDEPENDENT_AMBULATORY_CARE_PROVIDER_SITE_OTHER): Payer: Self-pay

## 2020-04-02 ENCOUNTER — Other Ambulatory Visit (INDEPENDENT_AMBULATORY_CARE_PROVIDER_SITE_OTHER): Payer: Self-pay

## 2020-04-02 ENCOUNTER — Encounter (INDEPENDENT_AMBULATORY_CARE_PROVIDER_SITE_OTHER): Payer: Self-pay | Admitting: Orthopaedic Surgery

## 2020-04-02 DIAGNOSIS — M25871 Other specified joint disorders, right ankle and foot: Secondary | ICD-10-CM

## 2020-04-02 DIAGNOSIS — M659 Synovitis and tenosynovitis, unspecified: Secondary | ICD-10-CM

## 2020-04-29 ENCOUNTER — Other Ambulatory Visit (INDEPENDENT_AMBULATORY_CARE_PROVIDER_SITE_OTHER): Payer: Self-pay | Admitting: Orthopaedic Surgery

## 2020-04-29 MED ORDER — DICLOFENAC SODIUM 75 MG PO TBEC
75.0000 mg | DELAYED_RELEASE_TABLET | Freq: Two times a day (BID) | ORAL | 0 refills | Status: DC
Start: 2020-04-29 — End: 2020-05-07

## 2020-05-07 ENCOUNTER — Other Ambulatory Visit (INDEPENDENT_AMBULATORY_CARE_PROVIDER_SITE_OTHER): Payer: Self-pay | Admitting: Orthopaedic Surgery

## 2020-05-07 MED ORDER — MELOXICAM 15 MG PO TABS
15.0000 mg | ORAL_TABLET | Freq: Every day | ORAL | 0 refills | Status: AC
Start: 2020-05-07 — End: 2020-07-06

## 2020-06-05 ENCOUNTER — Other Ambulatory Visit (INDEPENDENT_AMBULATORY_CARE_PROVIDER_SITE_OTHER): Payer: Self-pay | Admitting: Orthopaedic Surgery

## 2020-06-05 ENCOUNTER — Ambulatory Visit (INDEPENDENT_AMBULATORY_CARE_PROVIDER_SITE_OTHER): Payer: No Typology Code available for payment source | Admitting: Orthopaedic Surgery

## 2020-06-05 ENCOUNTER — Encounter (INDEPENDENT_AMBULATORY_CARE_PROVIDER_SITE_OTHER): Payer: Self-pay | Admitting: Orthopaedic Surgery

## 2020-06-05 DIAGNOSIS — M25471 Effusion, right ankle: Secondary | ICD-10-CM

## 2020-06-05 DIAGNOSIS — M25871 Other specified joint disorders, right ankle and foot: Secondary | ICD-10-CM

## 2020-06-05 NOTE — Progress Notes (Signed)
[facility]        Provider: Aldean Baker, MD  Patient: Marie Mata  DOB: Dec 24, 2000  AGE: 20 y.o.  MR#:  [id number]  Sport/Activity: lacrosse at [employer]    SUBJECTIVE:  Marie Mata is a pleasant 20 y.o. female who presents for follow-up evaluation of right ankle pain.  She is well-known to me for this problem.  We have been treating her for right ankle impingement which became a problem for her in October of last year but seem to improve with certain treatments by January.  She had had a steroid injection and several months of physical therapy.  She also had a course of Mobic which back in the fall did help her.  From January through the end of April, she essentially had no issues with the ankle.      Then, during a game towards end of the season, she rolled the ankle and had recurrence of the pain with some swelling.  She gets pain particularly with dorsiflexion and impact loading.  She has been taking Mobic for the last 4 weeks, but has not helped her significantly.  I prescribed this for her.  She has continued to work with her trainers at school on dedicated physical therapy for the ankle, but the pain persists.      Past Medical History:   Diagnosis Date   . Ingrown toenail    . Seasonal allergic rhinitis      Past Surgical History:   Procedure Laterality Date   . NO PAST SURGERIES       Current Outpatient Medications on File Prior to Visit   Medication Sig Dispense Refill   . albuterol sulfate HFA (PROVENTIL) 108 (90 Base) MCG/ACT inhaler Inhale 2 puffs into the lungs as needed for Wheezing     . budesonide-formoterol (SYMBICORT) 80-4.5 MCG/ACT inhaler INHALE TWO PUFFS BY MOUTH BEFORE ACTIVITY     . Fluticasone Propionate (XHANCE NA) by Nasal route Nasal spray     . meloxicam (MOBIC) 15 MG tablet Take 1 tablet (15 mg total) by mouth daily 60 tablet 0   . Norgestim-Eth Estrad Triphasic 0.18/0.215/0.25 MG-25 MCG Tab Take 1 tablet by mouth daily      . sertraline (ZOLOFT) 100 MG tablet        Current Facility-Administered Medications on File Prior to Visit   Medication Dose Route Frequency Provider Last Rate Last Admin   . triamcinolone acetonide (KENALOG-40) 40 MG/ML injection 20 mg  20 mg Intra-articular Once Salem Senate, MD         Allergies   Allergen Reactions   . Peanut Oil Anaphylaxis   . Sulfa Antibiotics Hives     Social History     Socioeconomic History   . Marital status: Unknown   Tobacco Use   . Smoking status: Never Smoker   . Smokeless tobacco: Current User   Vaping Use   . Vaping Use: Never used   Substance and Sexual Activity   . Alcohol use: No   Social History Narrative    ** Merged History Encounter **          Family History   Problem Relation Age of Onset   . No known problems Mother    . No known problems Father        REVIEW OF SYSTEMS:  All other systems are negative except as mentioned in the HPI    PHYSICAL EXAM:  There were no  vitals filed for this visit.     General: The patient is a well appearing female who appears the stated age and is in no acute distress    Right foot and ankle:    Skin: intact, no open wounds    Swelling: none    Ecchymosis: absent    Hindfoot Alignment: neutral    Foot Alignment: neutral    Range of Motion:        Ankle joint dorsiflexion w/knee in extension: 5 deg        Ankle joint dorsiflexion w/knee in flexion: 10 deg        Ankle joint plantarflexion with knee: 45 deg   ROM with pain: yes, at extremes of DF and PF          Subtalar joint eversion: 5 deg        Subtalar joint inversion: 10 deg              ROM with pain: no    Tenderness:   Tender to palpation along the anterolateral aspect of the tibiotalar joint line, but also some discomfort around the sinus tarsi          Stability Exams:        Negative anterior drawer  Negative talar tilt  Negative external rotation stress                Motor: 5/5 strength with dorsiflexion, plantarflexion, eversion, inversion, EHL, FHL, toe flexion and  extension    Sensation: intact to light tough throughout superficial peroneal, deep peroneal, tibial, sural, saphenous nerve distributions    Vascular: 2+ DP and PT pulses      Left (Asymptomatic side) ankle  Alignment: neutral  No tenderness  Tenderness: none  Swelling: none  ROM: normal  Normal NV exam    IMAGING STUDIES:  None new    IMPRESSION:       ICD-10-CM    1. Ankle impingement syndrome, right  M25.871 MRI ankle right without contrast     Ambulatory referral to Physical Therapy   2. Right ankle swelling  M25.471 MRI ankle right without contrast       Plan:  The patient had an acute injury recently that has worsened her ankle pain.  She has swelling and pain with weightbearing, concerning for a talar osteochondral defect.  She has tried physical therapy and anti-inflammatory medications for the past 6 weeks without improvement.  At this point, I recommend an MRI to evaluate for cartilage defect.  I will see her back after the MRI is done.    ORDERS PLACED    Orders Placed This Encounter   Procedures   . MRI ankle right without contrast   . Ambulatory referral to Physical Therapy

## 2020-06-10 ENCOUNTER — Encounter (INDEPENDENT_AMBULATORY_CARE_PROVIDER_SITE_OTHER): Payer: Self-pay | Admitting: Orthopaedic Surgery

## 2020-06-11 ENCOUNTER — Telehealth (INDEPENDENT_AMBULATORY_CARE_PROVIDER_SITE_OTHER): Payer: No Typology Code available for payment source | Admitting: Orthopaedic Surgery

## 2020-06-13 ENCOUNTER — Ambulatory Visit: Payer: No Typology Code available for payment source

## 2020-06-17 ENCOUNTER — Ambulatory Visit (INDEPENDENT_AMBULATORY_CARE_PROVIDER_SITE_OTHER): Payer: No Typology Code available for payment source | Admitting: Sports Medicine

## 2020-06-18 ENCOUNTER — Encounter (INDEPENDENT_AMBULATORY_CARE_PROVIDER_SITE_OTHER): Payer: Self-pay | Admitting: Orthopaedic Surgery

## 2020-06-24 ENCOUNTER — Encounter (INDEPENDENT_AMBULATORY_CARE_PROVIDER_SITE_OTHER): Payer: Self-pay

## 2020-06-24 ENCOUNTER — Encounter (INDEPENDENT_AMBULATORY_CARE_PROVIDER_SITE_OTHER): Payer: Self-pay | Admitting: Orthopaedic Surgery

## 2020-06-25 ENCOUNTER — Telehealth (INDEPENDENT_AMBULATORY_CARE_PROVIDER_SITE_OTHER): Payer: No Typology Code available for payment source | Admitting: Orthopaedic Surgery

## 2020-06-25 ENCOUNTER — Ambulatory Visit (INDEPENDENT_AMBULATORY_CARE_PROVIDER_SITE_OTHER): Payer: No Typology Code available for payment source | Admitting: Sports Medicine

## 2020-06-26 ENCOUNTER — Ambulatory Visit (INDEPENDENT_AMBULATORY_CARE_PROVIDER_SITE_OTHER): Payer: No Typology Code available for payment source | Admitting: Sports Medicine

## 2020-06-26 ENCOUNTER — Other Ambulatory Visit: Payer: Self-pay

## 2020-06-26 DIAGNOSIS — M25871 Other specified joint disorders, right ankle and foot: Secondary | ICD-10-CM

## 2020-06-26 DIAGNOSIS — M25571 Pain in right ankle and joints of right foot: Secondary | ICD-10-CM

## 2020-06-26 DIAGNOSIS — M65871 Other synovitis and tenosynovitis, right ankle and foot: Secondary | ICD-10-CM

## 2020-06-27 ENCOUNTER — Ambulatory Visit: Payer: No Typology Code available for payment source

## 2020-06-27 DIAGNOSIS — M25871 Other specified joint disorders, right ankle and foot: Secondary | ICD-10-CM | POA: Insufficient documentation

## 2020-06-27 DIAGNOSIS — M65871 Other synovitis and tenosynovitis, right ankle and foot: Secondary | ICD-10-CM | POA: Insufficient documentation

## 2020-06-27 DIAGNOSIS — M25571 Pain in right ankle and joints of right foot: Secondary | ICD-10-CM | POA: Insufficient documentation

## 2020-06-27 NOTE — PSS Phone Screening (Signed)
Pre-Anesthesia Evaluation    Pre-op phone visit requested by:   Reason for pre-op phone visit: Patient anticipating RIGHT ANKLE ARTHROSCOPY; RIGHT BONE GRAFT, RIGHT BONE GRAFT procedure.     Park in [place] garage [employer])  - arrive 2 hours early.    Day of procedure contact: [facility] [phone].    History of Present Illness/Summary:        Problem List:  Medical Problems             Hospital Problem List  Date Reviewed: 06/05/2020   None           Non-Hospital Problem List  Date Reviewed: 06/05/2020          ICD-10-CM Priority Class Noted    Concussion without loss of consciousness, initial encounter S06.0X0A   01/07/2016    Concussion without loss of consciousness, subsequent encounter S06.0X0D   02/02/2016    Right ankle pain, unspecified chronicity M25.571   06/27/2020    Overview Signed 06/27/2020  8:36 AM by Minus Breeding     Added automatically from request for surgery [phone]           Other synovitis and tenosynovitis, right ankle and foot M65.871   06/27/2020    Overview Signed 06/27/2020  8:36 AM by Minus Breeding     Added automatically from request for surgery [id number]           Subfibular impingement, 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 Anxiety    . Depression    . Ingrown toenail    . Right ankle pain    . Seasonal allergic rhinitis      Past Surgical History:   Procedure Laterality Date   . NO PAST SURGERIES         Current Outpatient Medications:   .  albuterol sulfate HFA (PROVENTIL) 108 (90 Base) MCG/ACT inhaler, Inhale 2 puffs into the lungs as needed for Wheezing, Disp: , Rfl:   .  budesonide-formoterol (SYMBICORT) 80-4.5 MCG/ACT inhaler, every evening, Disp: , Rfl:   .  Fluticasone Propionate (XHANCE NA), by Nasal route daily Nasal spray, Disp: , Rfl:   .  Norgestim-Eth Estrad Triphasic 0.18/0.215/0.25 MG-25  MCG Tab, Take 1 tablet by mouth every evening, Disp: , Rfl:   .  sertraline (ZOLOFT) 100 MG tablet, every evening, Disp: , Rfl:   .  meloxicam (MOBIC) 15 MG tablet, Take 1 tablet (15 mg total) by mouth daily, Disp: 60 tablet, Rfl: 0    Current Facility-Administered Medications:   .  triamcinolone acetonide (KENALOG-40) 40 MG/ML injection 20 mg, 20 mg, Intra-articular, Once, [place] Senate, MD     Medication List          Accurate as of June 27, 2020  3:07 PM. Always use your most recent med list.            albuterol sulfate HFA 108 (90 Base) MCG/ACT inhaler  Inhale 2 puffs into the lungs as needed for Wheezing  Commonly known as: PROVENTIL  Medication Adjustments for Surgery: Take as needed     budesonide-formoterol 80-4.5 MCG/ACT inhaler  every evening  Commonly known as: SYMBICORT  Medication Adjustments for Surgery: Take as  prescribed     meloxicam 15 MG tablet  Take 1 tablet (15 mg total) by mouth daily  Commonly known as: MOBIC     norgestimate-ethinyl estradiol triphasic 0.18/0.215/0.25 MG-25 MCG Tabs oral tablet  Take 1 tablet by mouth every evening  Medication Adjustments for Surgery: Take as prescribed     sertraline 100 MG tablet  every evening  Commonly known as: ZOLOFT  Medication Adjustments for Surgery: Take as prescribed     XHANCE NA  by Nasal route daily Nasal spray  Medication Adjustments for Surgery: Take morning of surgery          Allergies   Allergen Reactions   . Peanut Oil Anaphylaxis   . Sulfa Antibiotics Hives     Family History   Problem Relation Age of Onset   . No known problems Mother    . No known problems Father      Social History     Occupational History   . Not on file   Tobacco Use   . Smoking status: Never Smoker   . Smokeless tobacco: Never Used   Vaping Use   . Vaping Use: Never used   Substance and Sexual Activity   . Alcohol use: No   . Drug use: Never   . Sexual activity: Not on file       Menstrual History:   LMP / Status  Having periods     No LMP recorded (lmp  unknown).    Tubal Ligation?  No valid surgical or medical questions entered.             Exam Scores:   SDB score Risk Category: No Risk    PONV score Nausea Risk: SEVERE RISK    MST score MST Score: 0    Allergy score Risk Category: Low Risk    Frailty score         Visit Vitals  Ht 1.575 m (5\' 2" )   Wt 54.4 kg (120 lb)   LMP  (LMP Unknown)   BMI 21.95 kg/m

## 2020-06-27 NOTE — Pre-Procedure Instructions (Signed)
Important Instructions Before Your Procedure        Your case is currently scheduled for 07/03/2020 at [facility] MAIN OR with SANDERS, THOMAS H.      The date and/or time of your surgery may change.  Your surgeon's office will notify you, up until the business day before surgery, if there is any change to your surgery date or time.  Please don't hesitate to call your surgeon's office directly with any questions.    Park in [place] [facility] ([facility])  - arrive 2 hours early.    Day of procedure contact: [facility] [phone].        IMPORTANT You must visit the Preparing for Your Procedure guide link below for additional instructions including fasting guidelines and directions before your procedure. If you received fasting or skin preparation instructions from your surgeon or pre-procedural provider, please follow those specific instructions. The instructions here are general instructions that do not pertain to all patients.  [email or web address]    If the link doesn't open, please copy and paste in your browser

## 2020-06-30 ENCOUNTER — Other Ambulatory Visit: Payer: Self-pay

## 2020-06-30 ENCOUNTER — Encounter (INDEPENDENT_AMBULATORY_CARE_PROVIDER_SITE_OTHER): Payer: Self-pay

## 2020-07-03 ENCOUNTER — Ambulatory Visit: Admit: 2020-07-03 | Payer: No Typology Code available for payment source | Admitting: Orthopaedic Surgery

## 2020-07-03 DIAGNOSIS — M25571 Pain in right ankle and joints of right foot: Secondary | ICD-10-CM

## 2020-07-03 DIAGNOSIS — M25871 Other specified joint disorders, right ankle and foot: Secondary | ICD-10-CM

## 2020-07-03 DIAGNOSIS — M65871 Other synovitis and tenosynovitis, right ankle and foot: Secondary | ICD-10-CM

## 2020-07-03 SURGERY — ARTHROSCOPY, ANKLE
Anesthesia: General/Block | Site: Ankle | Laterality: Right

## 2020-11-17 ENCOUNTER — Encounter (INDEPENDENT_AMBULATORY_CARE_PROVIDER_SITE_OTHER): Payer: Self-pay | Admitting: Orthopaedic Surgery

## 2021-06-18 IMAGING — CR DG ANKLE COMPLETE 3+V*R*
1 series · 3 of 3 positions shown · non-contrast
Comparison: Right foot series 05/11/2019.

CLINICAL DATA: Sprain right foot 5555.  Worsening ankle pain.

EXAM:
RIGHT ANKLE - COMPLETE 3+ VIEW

[Series 1: dg ankle complete right · 0.14mm/px · 3 of 3 slices shown]
[im 1/3]
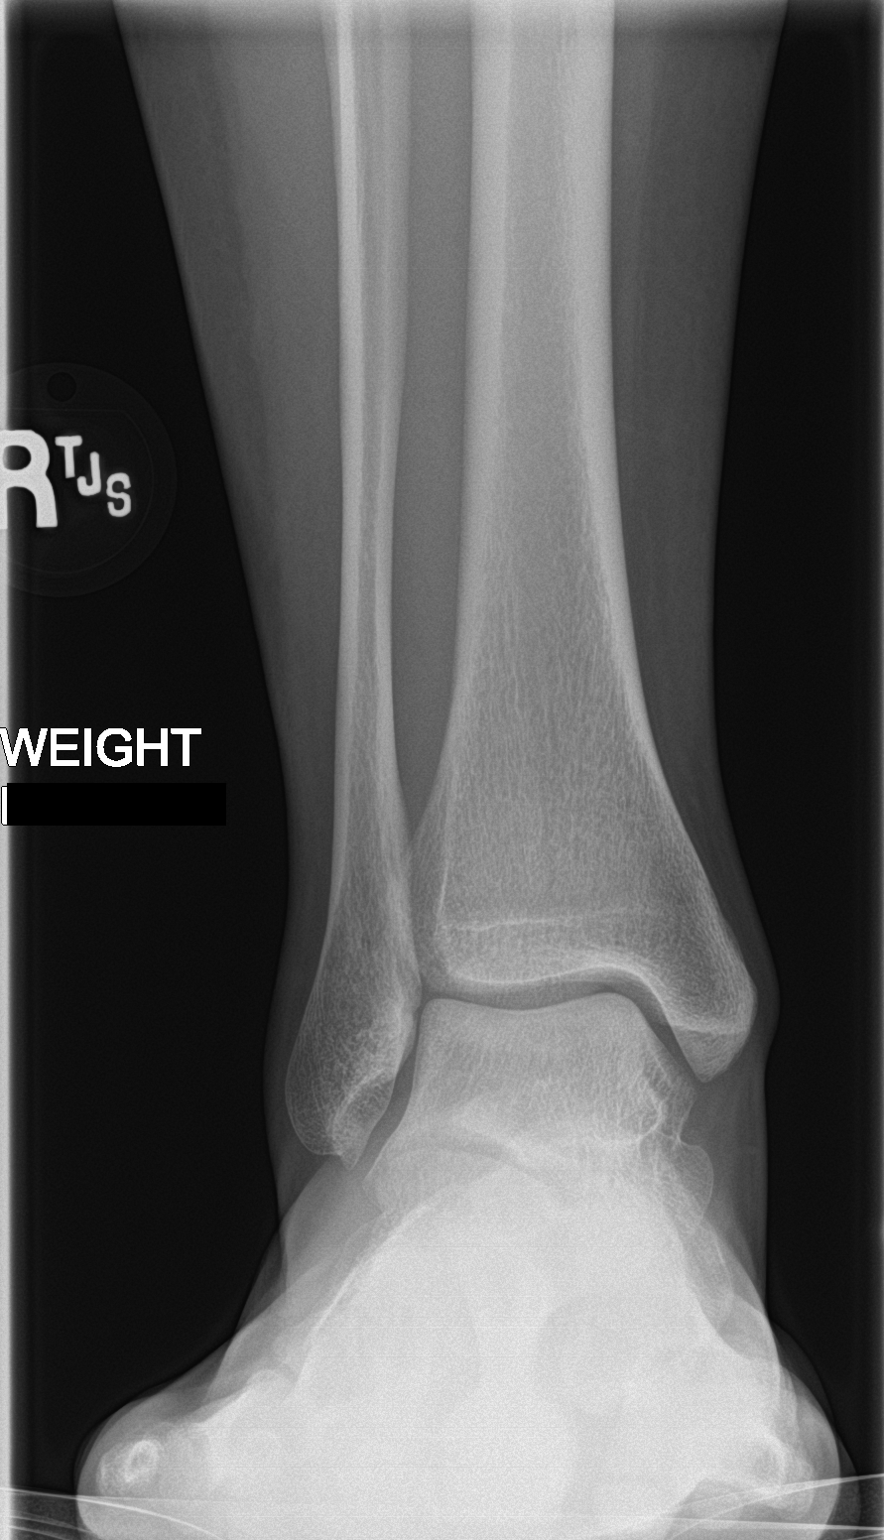
[im 2/3]
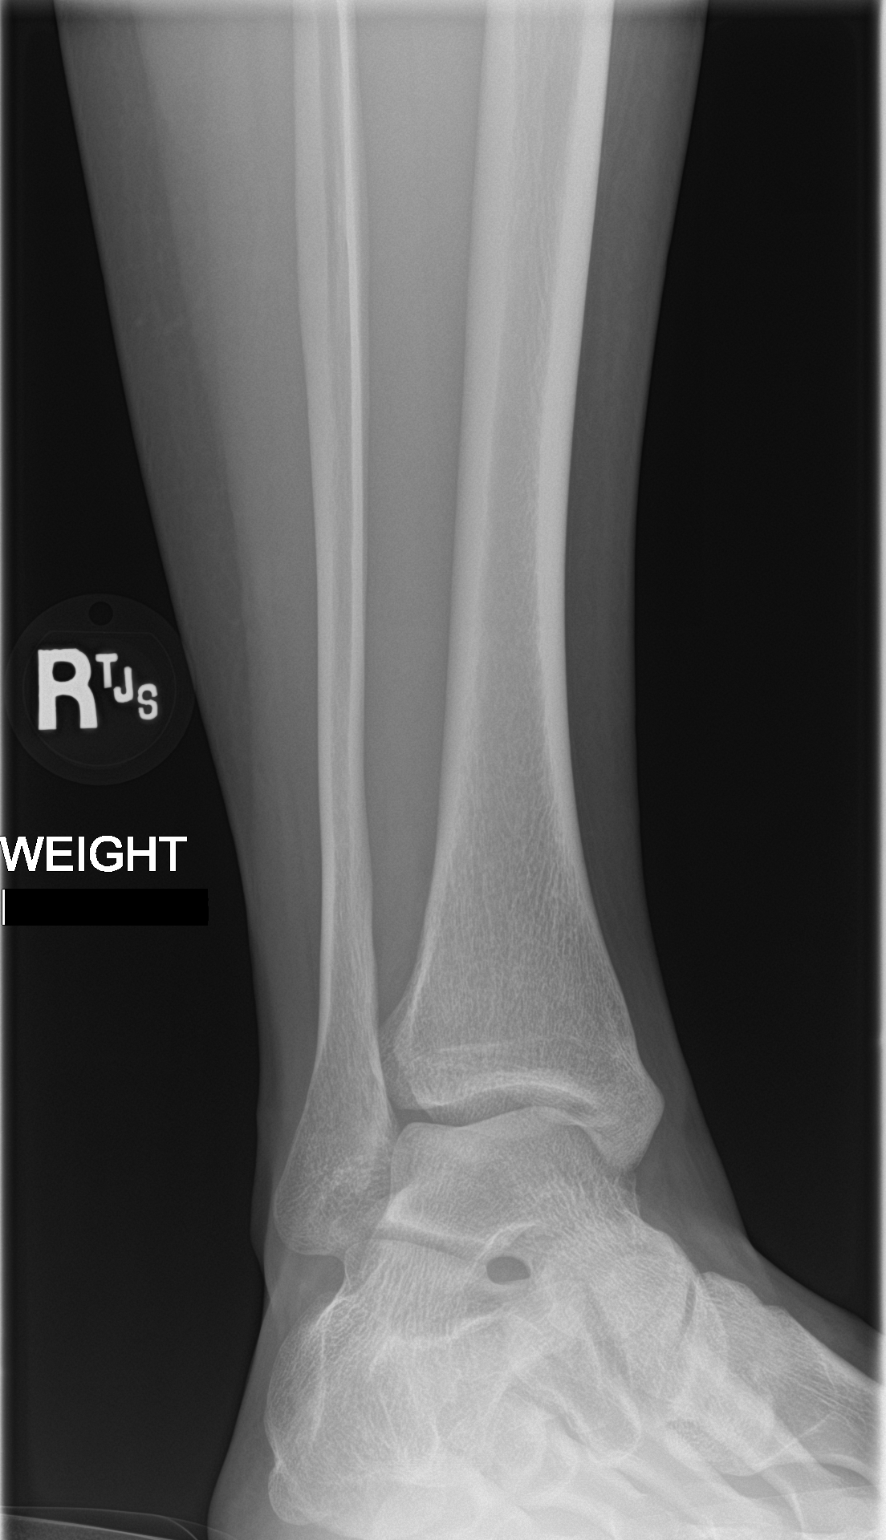
[im 3/3]
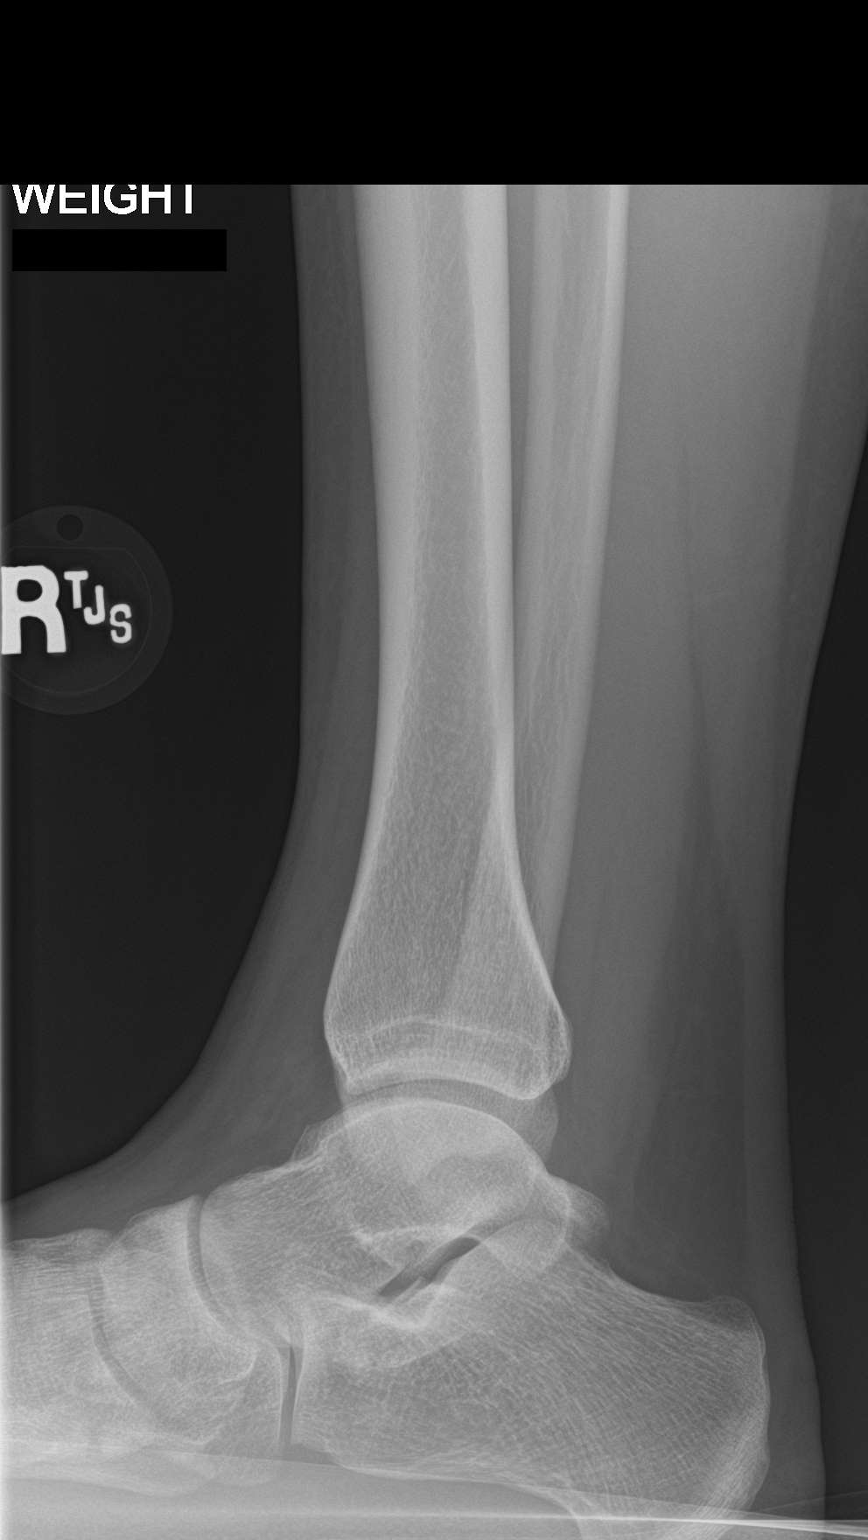

[3 of 3 positions shown; findings below may reference images not displayed]

FINDINGS: No acute or focal bony or joint abnormality identified. Soft tissues
are unremarkable.
IMPRESSION: No acute or focal abnormality.

## 2023-08-04 ENCOUNTER — Encounter (INDEPENDENT_AMBULATORY_CARE_PROVIDER_SITE_OTHER): Payer: Self-pay

## 2023-08-04 ENCOUNTER — Encounter (INDEPENDENT_AMBULATORY_CARE_PROVIDER_SITE_OTHER)
